# Patient Record
Sex: Male | Born: 1945 | Race: White | Hispanic: No | Smoking: Former smoker
Health system: Southern US, Community
[De-identification: ages and names within clinical notes are randomized; demographics above are authoritative.]

## PROBLEM LIST (undated history)

## (undated) DIAGNOSIS — F410 Panic disorder [episodic paroxysmal anxiety] without agoraphobia: Secondary | ICD-10-CM

## (undated) DIAGNOSIS — S82899A Other fracture of unspecified lower leg, initial encounter for closed fracture: Secondary | ICD-10-CM

## (undated) DIAGNOSIS — R972 Elevated prostate specific antigen [PSA]: Secondary | ICD-10-CM

## (undated) DIAGNOSIS — R310 Gross hematuria: Secondary | ICD-10-CM

## (undated) DIAGNOSIS — N401 Enlarged prostate with lower urinary tract symptoms: Secondary | ICD-10-CM

## (undated) DIAGNOSIS — R6882 Decreased libido: Secondary | ICD-10-CM

## (undated) DIAGNOSIS — N529 Male erectile dysfunction, unspecified: Secondary | ICD-10-CM

## (undated) HISTORY — DX: Other fracture of unspecified lower leg, initial encounter for closed fracture: S82.899A

## (undated) HISTORY — DX: Male erectile dysfunction, unspecified: N52.9

## (undated) HISTORY — DX: Benign prostatic hyperplasia with lower urinary tract symptoms: N40.1

## (undated) HISTORY — DX: Elevated prostate specific antigen (PSA): R97.20

## (undated) HISTORY — PX: ANKLE FRACTURE SURGERY: SHX122

## (undated) HISTORY — DX: Decreased libido: R68.82

## (undated) HISTORY — PX: TONSILLECTOMY: SUR1361

## (undated) HISTORY — DX: Gross hematuria: R31.0

## (undated) HISTORY — DX: Panic disorder (episodic paroxysmal anxiety): F41.0

---

## 2008-12-13 ENCOUNTER — Ambulatory Visit: Payer: Self-pay | Admitting: Gastroenterology

## 2008-12-21 ENCOUNTER — Ambulatory Visit: Payer: Self-pay | Admitting: Gastroenterology

## 2012-07-27 DIAGNOSIS — R6882 Decreased libido: Secondary | ICD-10-CM

## 2012-07-27 DIAGNOSIS — N529 Male erectile dysfunction, unspecified: Secondary | ICD-10-CM

## 2012-07-27 DIAGNOSIS — N401 Enlarged prostate with lower urinary tract symptoms: Secondary | ICD-10-CM

## 2012-07-27 HISTORY — DX: Benign prostatic hyperplasia with lower urinary tract symptoms: N40.1

## 2012-07-27 HISTORY — DX: Decreased libido: R68.82

## 2012-07-27 HISTORY — DX: Male erectile dysfunction, unspecified: N52.9

## 2013-02-27 ENCOUNTER — Ambulatory Visit: Payer: Self-pay | Admitting: Orthopedic Surgery

## 2013-03-07 ENCOUNTER — Ambulatory Visit: Payer: Self-pay | Admitting: Orthopedic Surgery

## 2013-07-31 DIAGNOSIS — R972 Elevated prostate specific antigen [PSA]: Secondary | ICD-10-CM

## 2013-07-31 HISTORY — DX: Elevated prostate specific antigen (PSA): R97.20

## 2014-08-10 ENCOUNTER — Emergency Department: Payer: Self-pay | Admitting: Emergency Medicine

## 2014-08-10 LAB — URINALYSIS, COMPLETE
Bacteria: NONE SEEN
RBC,UR: 48320 /HPF (ref 0–5)
Specific Gravity: 1.027 (ref 1.003–1.030)
Squamous Epithelial: NONE SEEN
WBC UR: NONE SEEN /HPF (ref 0–5)

## 2014-08-10 LAB — CBC WITH DIFFERENTIAL/PLATELET
BASOS ABS: 0.1 10*3/uL (ref 0.0–0.1)
Basophil %: 0.8 %
Eosinophil #: 0.4 10*3/uL (ref 0.0–0.7)
Eosinophil %: 3.8 %
HCT: 44.4 % (ref 40.0–52.0)
HGB: 14.6 g/dL (ref 13.0–18.0)
LYMPHS PCT: 14.8 %
Lymphocyte #: 1.5 10*3/uL (ref 1.0–3.6)
MCH: 29.9 pg (ref 26.0–34.0)
MCHC: 33 g/dL (ref 32.0–36.0)
MCV: 91 fL (ref 80–100)
Monocyte #: 0.7 x10 3/mm (ref 0.2–1.0)
Monocyte %: 7.1 %
NEUTROS ABS: 7.5 10*3/uL — AB (ref 1.4–6.5)
Neutrophil %: 73.5 %
PLATELETS: 231 10*3/uL (ref 150–440)
RBC: 4.89 10*6/uL (ref 4.40–5.90)
RDW: 14.7 % — AB (ref 11.5–14.5)
WBC: 10.2 10*3/uL (ref 3.8–10.6)

## 2014-08-10 LAB — BASIC METABOLIC PANEL
Anion Gap: 7 (ref 7–16)
BUN: 18 mg/dL (ref 7–18)
CREATININE: 1.36 mg/dL — AB (ref 0.60–1.30)
Calcium, Total: 8.9 mg/dL (ref 8.5–10.1)
Chloride: 104 mmol/L (ref 98–107)
Co2: 29 mmol/L (ref 21–32)
GFR CALC NON AF AMER: 55 — AB
GLUCOSE: 132 mg/dL — AB (ref 65–99)
Osmolality: 283 (ref 275–301)
Potassium: 4.1 mmol/L (ref 3.5–5.1)
Sodium: 140 mmol/L (ref 136–145)

## 2014-08-11 ENCOUNTER — Inpatient Hospital Stay: Payer: Self-pay | Admitting: Internal Medicine

## 2014-08-12 LAB — CBC WITH DIFFERENTIAL/PLATELET
BASOS ABS: 0.1 10*3/uL (ref 0.0–0.1)
BASOS PCT: 0.6 %
Eosinophil #: 0.3 10*3/uL (ref 0.0–0.7)
Eosinophil %: 2.3 %
HCT: 40.7 % (ref 40.0–52.0)
HGB: 13.4 g/dL (ref 13.0–18.0)
LYMPHS ABS: 1.6 10*3/uL (ref 1.0–3.6)
LYMPHS PCT: 12.8 %
MCH: 30 pg (ref 26.0–34.0)
MCHC: 33 g/dL (ref 32.0–36.0)
MCV: 91 fL (ref 80–100)
Monocyte #: 1 x10 3/mm (ref 0.2–1.0)
Monocyte %: 7.9 %
NEUTROS ABS: 9.5 10*3/uL — AB (ref 1.4–6.5)
Neutrophil %: 76.4 %
Platelet: 212 10*3/uL (ref 150–440)
RBC: 4.48 10*6/uL (ref 4.40–5.90)
RDW: 14.4 % (ref 11.5–14.5)
WBC: 12.5 10*3/uL — ABNORMAL HIGH (ref 3.8–10.6)

## 2014-08-12 LAB — BASIC METABOLIC PANEL
Anion Gap: 6 — ABNORMAL LOW (ref 7–16)
BUN: 17 mg/dL (ref 7–18)
CHLORIDE: 105 mmol/L (ref 98–107)
CO2: 27 mmol/L (ref 21–32)
CREATININE: 1.02 mg/dL (ref 0.60–1.30)
Calcium, Total: 8.6 mg/dL (ref 8.5–10.1)
EGFR (African American): >60
EGFR (Non-African Amer.): >60
Glucose: 91 mg/dL (ref 65–99)
Osmolality: 277 (ref 275–301)
POTASSIUM: 3.9 mmol/L (ref 3.5–5.1)
SODIUM: 138 mmol/L (ref 136–145)

## 2014-08-13 LAB — URINE CULTURE

## 2014-08-16 DIAGNOSIS — F410 Panic disorder [episodic paroxysmal anxiety] without agoraphobia: Secondary | ICD-10-CM

## 2014-08-16 DIAGNOSIS — T7840XA Allergy, unspecified, initial encounter: Secondary | ICD-10-CM | POA: Insufficient documentation

## 2014-08-16 DIAGNOSIS — S82899A Other fracture of unspecified lower leg, initial encounter for closed fracture: Secondary | ICD-10-CM | POA: Insufficient documentation

## 2014-08-16 HISTORY — DX: Panic disorder (episodic paroxysmal anxiety): F41.0

## 2014-10-01 DIAGNOSIS — R310 Gross hematuria: Secondary | ICD-10-CM

## 2014-10-01 HISTORY — DX: Gross hematuria: R31.0

## 2014-11-04 NOTE — Discharge Summary (Signed)
PATIENT NAME:  Andre Black, Andre MR#:  Black DATE OF BIRTH:  10-15-1945  DATE OF ADMISSION:  08/11/2014 DATE OF DISCHARGE:  08/12/2014  PRESENTING COMPLAINT: Bloody urine.  DISCHARGE DIAGNOSES: 1.  Acute hematuria secondary to most likely a bladder mass.  2.  Status post CBI, now has Foley catheter with self-irrigation. 3.  Hypertension.  4.  Benign prostatic hypertrophy.   PROCEDURES: CBI with Foley insertion.   CODE STATUS: FULL code.   CONSULTATIONS: Urology with Dr. Richardson LandryHouser.   DISCHARGE MEDICATIONS: 1.  Tamsulosin 0.4 mg p.o. daily.  2.  Amlodipine 2.5 mg daily.   DISCHARGE INSTRUCTIONS: The patient asked to hold off aspirin until hematuria resolves.  Follow up with Dr. Lonna CobbStoioff next week for hematuria and possible bladder mass.   The patient recommended continue self-cath irrigation per instructions with Foley care.  DIAGNOSTIC DATA: H and H 13.4 and 40.7. Creatinine 1.02.   CT of the abdomen shows hemorrhagic debris within the bladder which is compressed with the presence of Foley catheter. There is focal oval well-defined filling defect along the left posterior aspect of the bladder covering the trigone and measuring 4.1 x 5.7, which may represent blood clot versus mass. There is some degree of bladder outlet obstruction, 1.7 x 5.4 cm hyperdense collection posterior-superior to the prostate gland, likely a small region of hemorrhage.   BRIEF SUMMARY OF HOSPITAL COURSE: Andre Black is a 69 year old Caucasian gentleman with history of hypertension and BPH, who comes in with: 1.  Acute hematuria, which is suspected due to 4 x 5.7 cm bladder mass versus blood clot. He was seen by Dr. Richardson LandryHouser from urology who recommends continued CBI and go home with Foley catheter along with self-irrigation. The patient will follow up with his primary urologist, Dr. Lonna CobbStoioff, next week. He will call for an appointment on Monday morning.  2.  Hypertension. Continue amlodipine. 3.  BPH. Continue  tamsulosin. 4.  DVT prophylaxis with SCDs and TEDs since no antiplatelets were given due to the patient's hematuria.   Hospital stay otherwise remained stable. The patient remained a FULL code.   TIME SPENT: 40 minutes.  ____________________________ Wylie HailSona A. Allena KatzPatel, MD sap:sb D: 08/13/2014 07:00:18 ET T: 08/13/2014 07:57:17 ET JOB#: [id number]  cc: Akshaya Toepfer A. Allena KatzPatel, MD, <Dictator> Scott C. Lonna CobbStoioff, MD Tawni MillersEdward R. Weldon InchesHouser, II, MD  Willow OraSONA A Delonna Ney MD ELECTRONICALLY SIGNED 08/23/2014 23:25

## 2014-11-04 NOTE — Consult Note (Signed)
Admit Diagnosis:   HEMATURIA: Onset Date: 11-Aug-2014, Status: Active, Description: HEMATURIA      Admit Reason:   Hematuria (R31.9): Status: Active, Coding System: ICD10, Coded Name: Hematuria, unspecified    broken ankle:    enlarged prostate:    tonsilectomy:   Home Medications: Medication Instructions Status  tamsulosin 0.4 mg oral capsule 1 cap(s) orally once a day Active  Aspirin Enteric Coated 81 mg oral delayed release tablet 1 tab(s) orally once a day Active  amLODIPine 2.5 mg oral tablet 1 tab(s) orally once a day Active   Radiology Results:  Radiology Results: CT:    06-Feb-16 07:44, CT Abdomen Pelvis WO for Stone  CT Abdomen Pelvis WO for Stone  REASON FOR EXAM:    hematuria, urinary obstruction  COMMENTS:       PROCEDURE: CT  - CT ABDOMEN /PELVIS WO (STONE)  - Aug 11 2014  7:44AM     CLINICAL DATA:  Pt visible blood in urine starting last night around  7p, pt states pain only when bladder is full, no pain while  urinating. Pt had Foley cath placed this am and was released from  hospital. Pt had to return due to leaking around cath. Pt denies hx  of surgery to abd/pelvis or hx of ca. Standard Stone Study  performed.    EXAM:  CT ABDOMEN AND PELVIS WITHOUT CONTRAST  TECHNIQUE:  Multidetector CT imaging of the abdomen and pelvis was performed  following the standard protocol without IV contrast.    COMPARISON:  None.    FINDINGS:  Lung bases are within normal.    Abdominal images demonstrate multiple well-defined round oval liver  hypodensities likely cysts with the largest over the inferior right  lobe as well as left lobe measuring 2.1 cm. 2.1 cm gallstone. The  spleen, pancreas and adrenal glands are within normal. Appendix is  normal. There is mild calcified plaque over the abdominal aorta and  iliac arteries. Minimal diverticulosis of the colon without active  inflammation.    Kidneys are normal size without focal mass or nephrolithiasis.  No  significant hydronephrosis. Ureters are normal.    Pelvic images demonstrate a Foley catheter within the bladder. There  is increased density material within the bladder likely due to  hemorrhage. There is a fairly well-defined oval soft tissue density  creating a filling defect along the left posterior aspect of the  bladder which is isodense and continuous with the adjacent prostate  gland measuring approximately 4.1 x 5.7 cm in AP and transverse  dimension. This may represent blood clot versus mass extends over  the region of the trigone which may be causing a degree of outlet  obstruction. The Foley catheter passes through the anterior inferior  aspect of this filling defect. There are 2 small bilateral iliac  chain lymph nodes with the largest over the left side measuring 1 x  1.6 cm. 1.7 x 5.4 cm collection of slightly hyperdense material just  posterior and superior to the left side of the prostate likely a  small amount of hemorrhage.    There are moderate degenerative changes of the spine with multilevel  discdisease of the lumbar spine. 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 Degenerative changes of the hips.     IMPRESSION:  Hemorrhagic debris within the bladder which is decompressed with the  presence of a Foley catheter. There is a focal oval well-defined  filling defect  along the left posterior aspect of the bladder  covering the trigone and measuring 4.1 x 5.7 cm which may represent  blood clot versus mass. This may be causing some degree of bladder  outlet obstruction. 1.7 x 5.4 cm slightly hyperdense collection  posterior/superior to the prostate gland likely a small region of  hemorrhage. Few small iliac chain lymph nodes left greater than  right.    No evidence of for renal/ureteral stones or hydronephrosis.    Multiple liver cysts.    Cholelithiasis.    Minimal colonic  diverticulosis.    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    These results were called by telephone at the time of interpretation  on 08/11/2014 at 8:23 am to Dr. Wynonia Lawman , who verbally  acknowledged these results.      Electronically Signed    By: Elberta Fortis M.D.    On: 08/11/2014 08:24         Verified By: Elba Barman, M.D.,    Adhesive: Blisters, Rash  Other -Explain in Comment Field: Blisters, Rash  Nursing Flowsheets: **Vital Signs.:   06-Feb-16 08:29  Vital Signs Type Admission  Temperature Temperature (F) 97.4  Celsius 36.3  Temperature Source oral  Respirations Respirations 18  Systolic BP Systolic BP 151  Diastolic BP (mmHg) Diastolic BP (mmHg) 76  Mean BP 101  Pulse Ox % Pulse Ox % 95  Pulse Ox Activity Level  At rest  Oxygen Delivery Room Air/ 21 %    Present Illness 69 yo male in usual state of health admitted with gross hematuria and clots overnight.  Has prior history for BPH managed by Dr. Lonna Cobb with Tamsulosin.  Has yearly exams with him as well.  Has some baseline frequency and nocturia but denies history of hematuria.  This episode started last evening with bright red blood and progressed to clots as well.  No dysuria, fever, chills, or pain.  He denies any weight loss.  He does have a 45 pack/yr smoking history (quit in 2006).  Pt. has no other complaints.   Case History and Physical Exam:  Chief Complaint Gross Hematuria   Past Medical Health Hypertension, BPH   Past Surgical History None   Family History Non-Contributory   HEENT PERLA  NCAT   Neck/Nodes Supple  No Adenopathy   Chest/Lungs Clear   Cardiovascular Normal Sinus Rhythm   Abdomen Benign  x 4 quadrants   Genitalia WNL  24 Fr 3-way, pink uop, genetalia normal   Rectal Not examined   Musculoskeletal Full range of motion    Neurological Grossly WNL   Skin Warm  WNL    Impression 69 yo male with gross hematuria and 5.7 x 4.1 cm left sided bladder mass.  While could represent clot or extension of prostate, I suspect this is a large bladder tumor given his smoking history.   This was communicated with him in detail and he voices understanding.  The need for outpatient cystoscopy once clear with his primary Urologist was discussed.  I irrigated his catheter and was able to remove approx 30 ml of clot.  No further clot was removed and CBI was running clear on low rate.   Plan - continue CBI, wean to clear - likely home with catheter, could teach self-irrigations if needed - needs f/u for outpatient cystoscopy this coming week -  if hematuria worses, clot retention is unable to be cleared, or need for multiple transfusions arise, would consider cysto/clot evac/fulguration.   Electronic Signatures: Charyl Bigger (MD)  (Signed 06-Feb-16 10:48)  Authored: Health Issues, Significant Events - History, Home Medications, Radiology Results, Allergies, Vital Signs, General Aspect/Present Illness, History and Physical Exam, Impression/Plan   Last Updated: 06-Feb-16 10:48 by Charyl Bigger (MD)

## 2014-11-04 NOTE — H&P (Signed)
PATIENT NAME:  Andre Black, Beacher MR#:  [id number] DATE OF BIRTH:  07-29-1945  DATE OF ADMISSION:  08/11/2014  PRIMARY CARE PHYSICIAN: Rhona LeavensJames F. Burnett ShengHedrick, MD  CHIEF COMPLAINT: Bloody urine today.   HISTORY OF PRESENT ILLNESS: Andre Black is a 69 year old Caucasian gentleman with past medical history of hypertension and BPH who came to the Emergency Room last night after he started having a significant amount of hematuria without any abdominal pain. A Foley catheter was put in. the patient was started on CBI and, once his urine cleared out, he was discharged to home to follow up with outpatient urology. The patient's urologist is Dr. [facility]Sto[facility]ff at [facility] [place]. As he was going, he  started having abdominal pain. His catheter appeared to have clotted, and he came back. CBI was started again. The patient started having a lot of clots, and internal medicine was consulted for admission.   His CT of the abdomen shows 5.7 x 4.1 cm, left-sided bladder mass. Cannot exclude that this could be a clot. He is being admitted for further evaluation and management.   PAST MEDICAL HISTORY:   1.  Hypertension.  2.  Benign prostatic hypertrophy.   FAMILY HISTORY: Brother with stomach cancer and another brother with throat and lung cancer.   MEDICATIONS:  1.  Tamsulosin 0.4 mg p.o. daily.  2.  Aspirin 81 mg daily.  3.  Amlodipine 2.5 mg daily.   ALLERGIES: Adhesive tape.    SOCIAL HISTORY: He lives by himself. He is a nonsmoker and denies any alcohol use.   REVIEW OF SYSTEMS: CONSTITUTIONAL: No fever, fatigue, weakness.  EYES: No blurred or double vision, glaucoma, or cataracts.  EARS, NOSE, THROAT: No tinnitus, ear pain, hearing loss.  RESPIRATORY: No cough, wheeze, hemoptysis.  CARDIOVASCULAR: No chest pain, orthopnea, edema. Positive for hypertension.  GASTROINTESTINAL: No nausea, vomiting, diarrhea, abdominal pain.  GENITOURINARY: Positive for hematuria. No frequency. Positive for enlarged prostate.   MUSCULOSKELETAL: No arthritis. Positive for back pain on and off. No swelling or gout.  NEUROLOGIC: No CVA, TIA, dysarthria, or seizures.  PSYCHIATRIC: No anxiety or depression.  All other systems reviewed are negative.   PHYSICAL EXAMINATION:  GENERAL: The patient is awake, alert, oriented x 3; not in acute distress.  VITAL SIGNS: Afebrile. Pulse is 79, blood pressure is 151/76, saturations are 95% on room air.  HEENT: Atraumatic, normocephalic. Pupils PERRLA. EOM intact. Oral mucosa is moist.  NECK: Supple. No JVD. No carotid bruit.  LUNGS: Clear to auscultation bilaterally. No rales, rhonchi, respiratory distress or labored breathing.  HEART: Both the heart sounds are normal. Rate and rhythm regular. PMI not lateralized. Chest nontender.  EXTREMITIES: Good pedal pulses, good femoral pulses. No lower extremity edema.  GENITOURINARY: The patient has a catheter with CBI ongoing; has pinkish-tinged urine output.  NEUROLOGIC: Grossly intact cranial nerves II through XII. No motor or sensory deficit.  SKIN: Warm and dry.  PSYCHIATRIC: The patient is awake, alert, oriented x 3.   LABORATORY DATA: CT of the abdomen and pelvis shows hemorrhaging debris within the bladder, which is decompressed with the presence of a Foley catheter. Focal, oval, well-defined filling defect along the left posterior aspect of the bladder, covering the trigone, measuring 4.1 x 5.7 cm, which may present blood clot versus mass. This may be causing some degree of bladder  outlet obstruction. A 1.7 x 4.5 cm, slightly hyperdense collection in the posterior superior to the prostate gland; likely a small region of hemorrhage. Few small iliac chain lymph  nodes, greater on the left than the right. Multiple liver cysts. Cholelithiasis. Minimal of colonic diverticulosis.   White count is 10.2, H and H are 14.4 and 44.4. Glucose is 132, BUN is 18, creatinine 1.3, sodium is 140, potassium is 4.1, chloride is 104. Urine positive for  hematuria.   ASSESSMENT: A 69 year old Andre Black with a history of benign prostatic hypertrophy and hypertension comes in with:  1.  Acute hematuria, which is suspected due to possible 4 x 5.7 cm bladder mass versus blood clot. The patient was seen by Dr. Richardson Landry from urology, who recommends continue CBI and possibly to go home with Foley catheter. Could teach self irrigations if needed, and follow up with Dr. Lonna Cobb as an outpatient next week. If condition worsens, will consider cystoscopy, clot evacuation and fulguration here in the hospital.  2.  Hypertension. Continue amlodipine.  3.  Benign prostatic hypertrophy on tamsulosin, which we will continue. 4.  Deep vein thrombosis prophylaxis. TEDs and SCDs in the setting of hematuria.   Above was discussed with the patient. The patient is a Full Code. Further work-up according to the patient's clinical course.   TIME SPENT: 50 minutes.    ____________________________ Wylie Hail Allena Katz, MD sap:MT D: 08/11/2014 12:22:38 ET T: 08/11/2014 12:41:05 ET JOB#: [id number]  cc: Kariem Wolfson A. Allena Katz, MD, <Dictator> Rhona Leavens. Burnett Sheng, MD Scott C. Lonna Cobb, MD Willow Ora MD ELECTRONICALLY SIGNED 08/23/2014 23:25

## 2015-06-05 IMAGING — CT CT ABD-PELV W/O CM
2 of 4 series · 15 of 46 positions shown, 17 images · non-contrast
Comparison: None.

CLINICAL DATA: Pt visible blood in urine starting last night around
7p, pt states pain only when bladder is full, no pain while
urinating. Pt had Foley cath placed this am and was released from
hospital. Pt had to return due to leaking around cath. Pt denies hx
of surgery to abd/pelvis or hx of ca. Standard Stone Study
performed.

EXAM:
CT ABDOMEN AND PELVIS WITHOUT CONTRAST
TECHNIQUE: Multidetector CT imaging of the abdomen and pelvis was performed
following the standard protocol without IV contrast.

[Series 2: stone standard full · axial · 0.76mm/px · z∈[-1026,-600]mm · 12 of 93 slices shown, 14 images]
[im 4/93  soft-tissue]
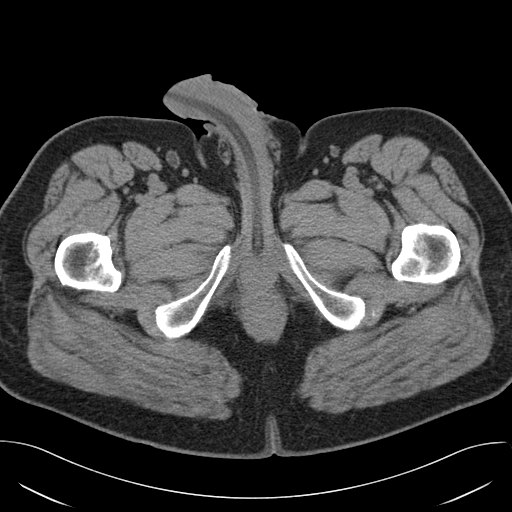
[im 4/93  bone]
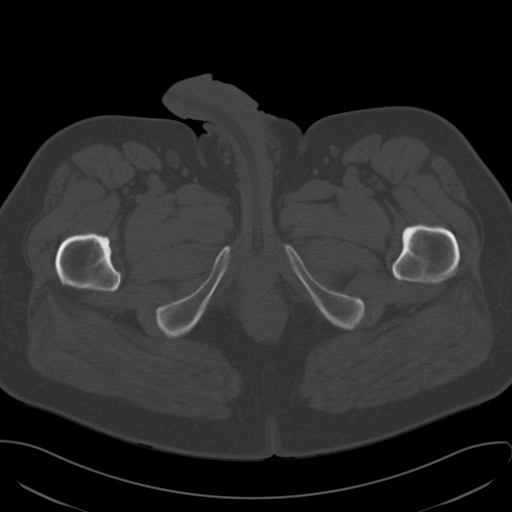
[im 12/93  soft-tissue]
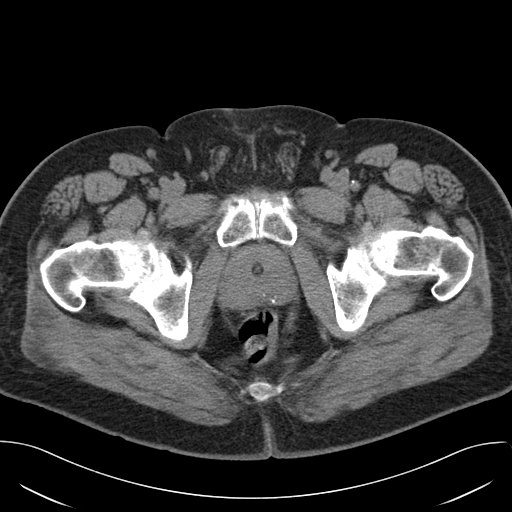
[im 20/93  soft-tissue]
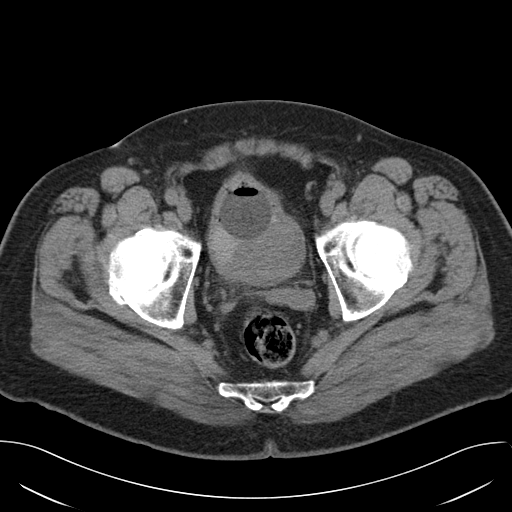
[im 27/93  soft-tissue]
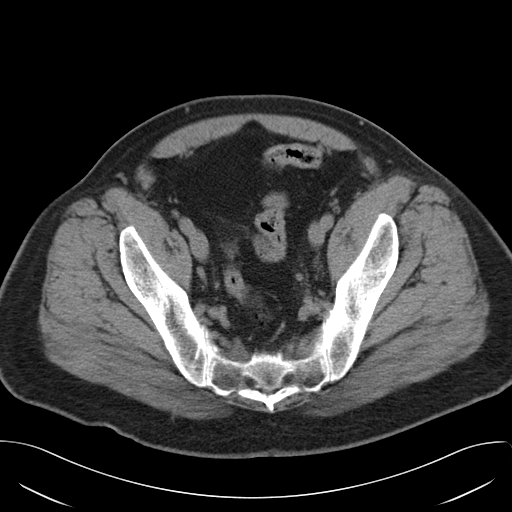
[im 35/93  soft-tissue]
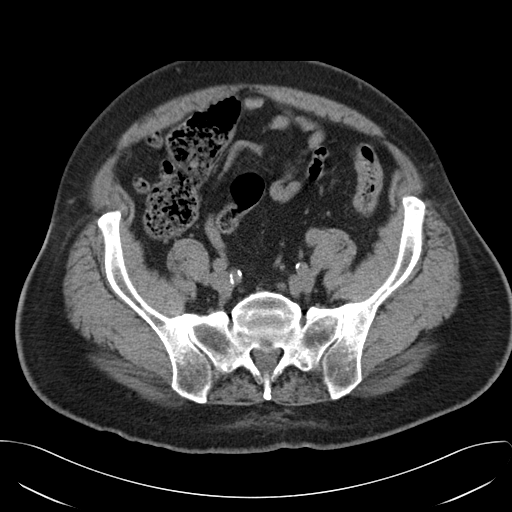
[im 43/93  soft-tissue]
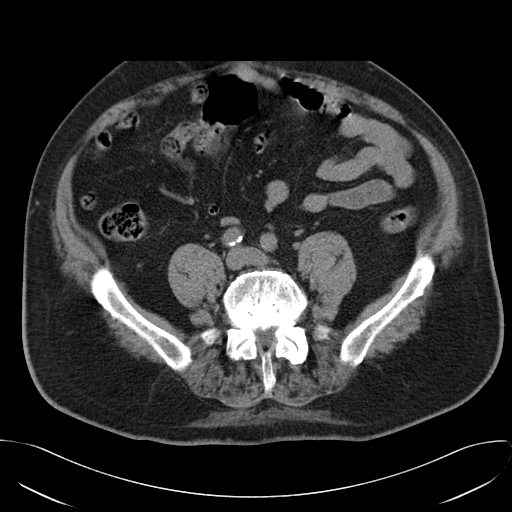
[im 50/93  soft-tissue]
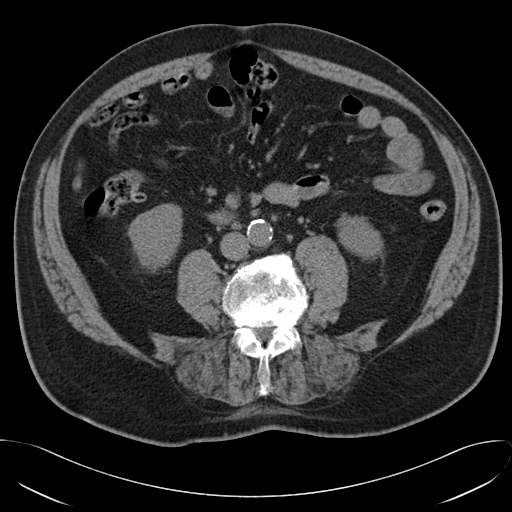
[im 58/93  soft-tissue]
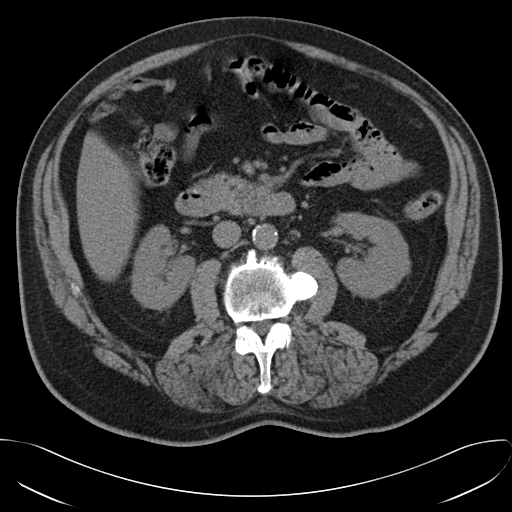
[im 66/93  soft-tissue]
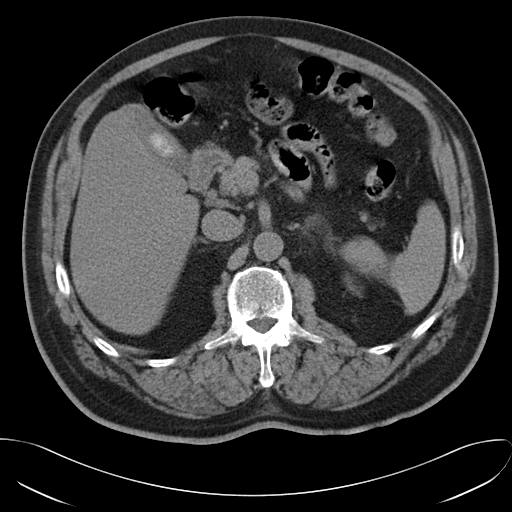
[im 66/93  bone]
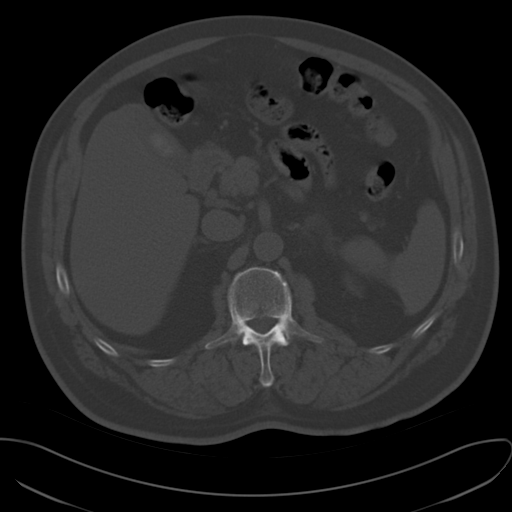
[im 73/93  soft-tissue]
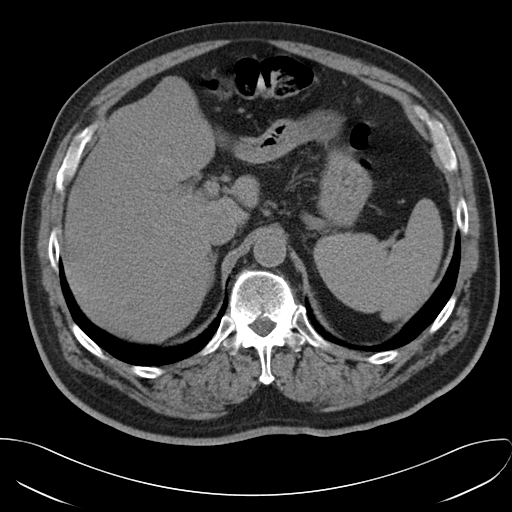
[im 81/93  soft-tissue]
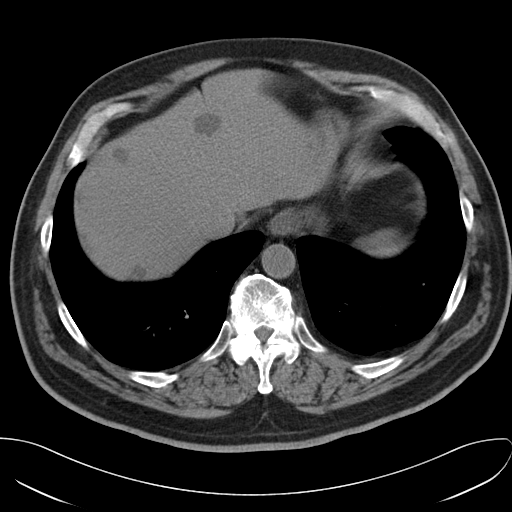
[im 89/93  soft-tissue]
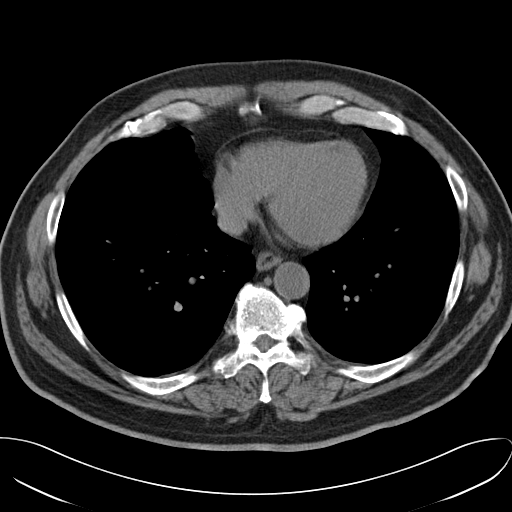

[Series 5: cor stone standard full · coronal · 0.72mm/px · 3 of 158 slices shown]
[im 53/158  soft-tissue]
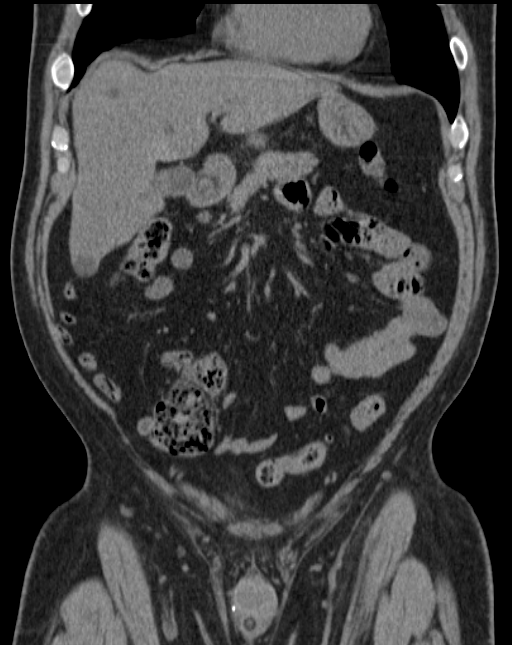
[im 70/158  soft-tissue]
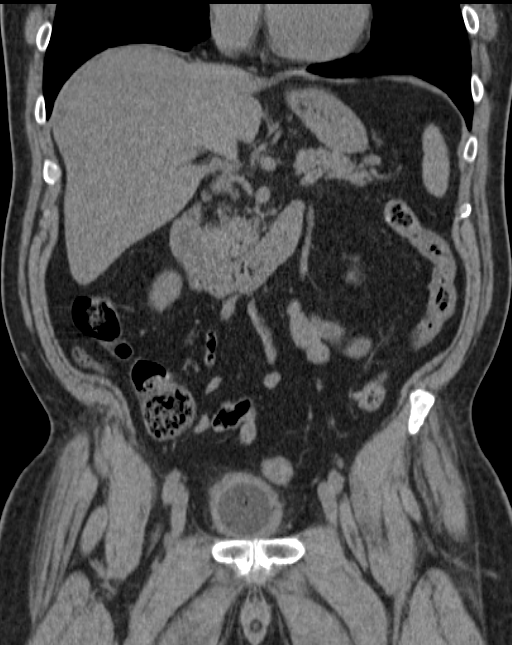
[im 88/158  soft-tissue]
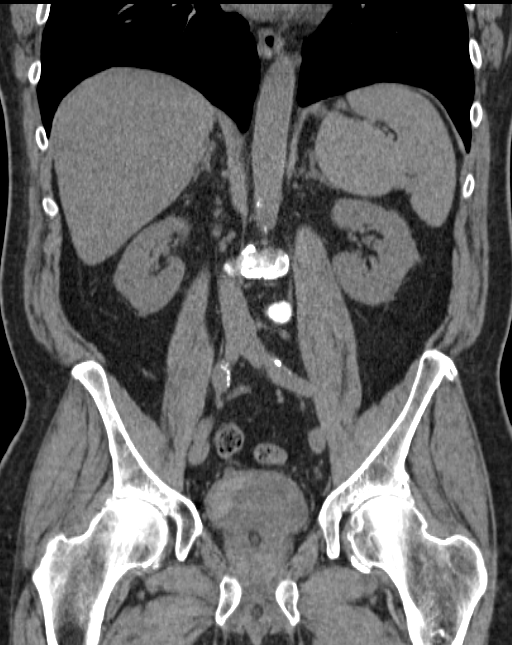

[15 of 46 positions shown; findings below may reference images not displayed]

FINDINGS: Lung bases are within normal.

Abdominal images demonstrate multiple well-defined round oval liver
hypodensities likely cysts with the largest over the inferior right
lobe as well as left lobe measuring 2.1 cm. 2.1 cm gallstone. The
spleen, pancreas and adrenal glands are within normal. Appendix is
normal. There is mild calcified plaque over the abdominal aorta and
iliac arteries. Minimal diverticulosis of the colon without active
inflammation.

Kidneys are normal size without focal mass or nephrolithiasis. No
significant hydronephrosis. Ureters are normal.

Pelvic images demonstrate a Foley catheter within the bladder. There
is increased density material within the bladder likely due to
hemorrhage. There is a fairly well-defined oval soft tissue density
creating a filling defect along the left posterior aspect of the
bladder which is isodense and continuous with the adjacent prostate
gland measuring approximately 4.1 x 5.7 cm in AP and transverse
dimension. This may represent blood clot versus mass extends over
the region of the trigone which may be causing a degree of outlet
obstruction. The Foley catheter passes through the anterior inferior
aspect of this filling defect. There are 2 small bilateral iliac
chain lymph nodes with the largest over the left side measuring 1 x
1.6 cm. 1.7 x 5.4 cm collection of slightly hyperdense material just
posterior and superior to the left side of the prostate likely a
small amount of hemorrhage.

There are moderate degenerative changes of the spine with multilevel
disc disease of the lumbar spine. Evidence of congenital short
pedicles with varying degrees of canal stenosis over the lumbar
region. Degenerative changes of the hips.
IMPRESSION: Hemorrhagic debris within the bladder which is decompressed with the
presence of a Foley catheter. There is a focal oval well-defined
filling defect along the left posterior aspect of the bladder
covering the trigone and measuring 4.1 x 5.7 cm which may represent
blood clot versus mass. This may be causing some degree of bladder
outlet obstruction. 1.7 x 5.4 cm slightly hyperdense collection
posterior/superior to the prostate gland likely a small region of
hemorrhage. Few small iliac chain lymph nodes left greater than
right.

No evidence of for renal/ureteral stones or hydronephrosis.

Multiple liver cysts.

Cholelithiasis.

Minimal colonic diverticulosis.

Degenerative change of the spine with multilevel disc disease over
the lumbar spine and congenital short pedicles with varying degrees
of canal stenosis over the lumbar region.

These results were called by telephone at the time of interpretation
on 08/11/2014 at [DATE] to Dr. Angi Billiot , who verbally
acknowledged these results.

## 2017-04-27 ENCOUNTER — Other Ambulatory Visit: Payer: Self-pay

## 2017-04-27 ENCOUNTER — Other Ambulatory Visit: Payer: Medicare PPO

## 2017-04-27 DIAGNOSIS — N401 Enlarged prostate with lower urinary tract symptoms: Secondary | ICD-10-CM

## 2017-04-28 LAB — PSA: PROSTATE SPECIFIC AG, SERUM: 6.4 ng/mL — AB (ref 0.0–4.0)

## 2017-04-29 ENCOUNTER — Ambulatory Visit
Admission: RE | Admit: 2017-04-29 | Discharge: 2017-04-29 | Disposition: A | Discharge status: Home / Self Care | Payer: Medicare PPO | Source: Ambulatory Visit | Attending: Urology | Admitting: Urology

## 2017-04-29 ENCOUNTER — Telehealth: Payer: Self-pay | Admitting: *Deleted

## 2017-04-29 ENCOUNTER — Ambulatory Visit: Payer: Medicare PPO | Admitting: Urology

## 2017-04-29 ENCOUNTER — Encounter: Payer: Self-pay | Admitting: Urology

## 2017-04-29 VITALS — BP 124/57 | HR 98 | Ht 71.0 in | Wt 245.0 lb

## 2017-04-29 DIAGNOSIS — R1030 Lower abdominal pain, unspecified: Secondary | ICD-10-CM

## 2017-04-29 DIAGNOSIS — S82899A Other fracture of unspecified lower leg, initial encounter for closed fracture: Secondary | ICD-10-CM | POA: Insufficient documentation

## 2017-04-29 DIAGNOSIS — R972 Elevated prostate specific antigen [PSA]: Secondary | ICD-10-CM

## 2017-04-29 DIAGNOSIS — N401 Enlarged prostate with lower urinary tract symptoms: Secondary | ICD-10-CM | POA: Diagnosis not present

## 2017-04-29 HISTORY — DX: Other fracture of unspecified lower leg, initial encounter for closed fracture: S82.899A

## 2017-04-29 NOTE — Progress Notes (Signed)
04/29/2017 9:35 AM   Andre Black February 03, 1946 [id number]   Chief Complaint  Patient presents with  . Benign Prostatic Hypertrophy    16month    HPI: 71 yo presents for follow-up BPH and elevated PSA.  Prior biopsy May 2010 for a PSA of 8.48 with benign pathology.  Prostate volume was calculated at 48 cc.  He had been on combination therapy with finasteride and tamsulosin however is presently on tamsulosin only.  Prostate MRI April 2017 showed BPH and no suspicious lesion.  He has stable lower urinary tract symptoms.  Last PSA August 2018 was 6.69.  A CT of the pelvis was performed September 2017 for follow-up of mildly prominent left external iliac lymph nodes which were stable.  Denies dysuria or gross hematuria.  Denies flank or abdominal pain.  He has mild groin pain and requested an x-ray today.   PMH: Past Medical History:  Diagnosis Date  . Broken ankle 04/29/2017  . 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 Elevated PSA 07/31/2013  . Enlarged prostate with lower urinary tract symptoms (LUTS) 07/27/2012  . Hematuria, gross 10/01/2014  . Panic attack 08/16/2014   Overview:  DUE TO A DIVORCE   Overview:  Overview:  DUE TO A DIVORCE   . Reduced libido 07/27/2012    Surgical History: Past Surgical History:  Procedure Laterality Date  . ANKLE FRACTURE SURGERY    . TONSILLECTOMY      Home Medications:  Allergies as of 04/29/2017      Reactions   Other Dermatitis, Hives, Itching, Photosensitivity, Rash, Swelling   Other reaction(s): Other (See Comments) Pt is allergic to all metals and solvents states his skin "falls off" : (chlorocresol, balsam of [place]iperu, cobalt chloride, cocamidopropyl betaine, chrome, gold, silver)   Cashew Nut Oil Hives   Iodides    Other reaction(s): Unknown Other reaction(s): UNKNOWN   Balsam Rash   Chlorocresol Rash   Cobalt Rash      Medication List       Accurate as of 04/29/17  9:35 AM. Always use your most recent med list.            amLODipine 5 MG tablet Commonly known as:  NORVASC Take 5 mg by mouth daily.   aspirin EC 81 MG tablet Take 81 mg by mouth.   tamsulosin 0.4 MG Caps capsule Commonly known as:  FLOMAX TAKE 1 CAPSULE (0.4 MG TOTAL) BY MOUTH DAILY.       Allergies:  Allergies  Allergen Reactions  . Other Dermatitis, Hives, Itching, Photosensitivity, Rash and Swelling    Other reaction(s): Other (See Comments) Pt is allergic to all metals and solvents states his skin "falls off" : (chlorocresol, balsam of [place], cobalt chloride, cocamidopropyl betaine, chrome, gold, silver)  . Cashew Nut Oil Hives  . Iodides     Other reaction(s): Unknown Other reaction(s): UNKNOWN  . Balsam Rash  . Chlorocresol Rash  . Cobalt Rash    Family History: Family History  Problem Relation Age of Onset  . Bladder Cancer Brother   . Kidney cancer Neg Hx   . Prostate cancer Neg Hx     Social History:  reports that he has quit smoking. He has never used smokeless tobacco. He reports that he does not drink alcohol or use drugs.  ROS: UROLOGY Frequent Urination?: No Hard to postpone urination?: No Burning/pain with urination?: No Get up at night to urinate?: Yes Leakage of urine?: No Urine stream starts and stops?: No  Trouble starting stream?: No Do you have to strain to urinate?: No Blood in urine?: No Urinary tract infection?: No Sexually transmitted disease?: No Injury to kidneys or bladder?: No Painful intercourse?: No Weak stream?: No Erection problems?: No Penile pain?: No  Gastrointestinal Nausea?: No Vomiting?: No Indigestion/heartburn?: No Diarrhea?: No Constipation?: No  Constitutional Fever: No Night sweats?: No Weight loss?: No Fatigue?: No  Skin Skin rash/lesions?: Yes Itching?: Yes  Eyes Blurred vision?: No Double vision?: No  Ears/Nose/Throat Sore throat?: No Sinus problems?: No  Hematologic/Lymphatic Swollen glands?: No Easy bruising?:  No  Cardiovascular Leg swelling?: No Chest pain?: No  Respiratory Cough?: No Shortness of breath?: No  Endocrine Excessive thirst?: No  Musculoskeletal Back pain?: No Joint pain?: No  Neurological Headaches?: No Dizziness?: No  Psychologic Depression?: No Anxiety?: No  Physical Exam: BP (!) 124/57   Pulse 98   Ht 5\' 11"  (1.803 m)   Wt 245 lb (111.1 kg)   BMI 34.17 kg/m   Constitutional:  Alert and oriented, No acute distress. HEENT: [place] AT, moist mucus membranes.  Trachea midline, no masses. Cardiovascular: No clubbing, cyanosis, or edema. Respiratory: Normal respiratory effort, no increased work of breathing. GI: Abdomen is soft, nontender, nondistended, no abdominal masses GU: No CVA tenderness.  Skin: No rashes, bruises or suspicious lesions. Lymph: No cervical or inguinal adenopathy. Neurologic: Grossly intact, no focal deficits, moving all 4 extremities. Psychiatric: Normal mood and affect.  Laboratory Data: Lab Results  Component Value Date   WBC 12.5 (H) 08/12/2014   HGB 13.4 08/12/2014   HCT 40.7 08/12/2014   MCV 91 08/12/2014   PLT 212 08/12/2014    Lab Results  Component Value Date   CREATININE 1.02 08/12/2014    Lab Results  Component Value Date   PSA1 6.4 (H) 04/27/2017    Urinalysis Lab Results  Component Value Date   APPEARANCEUR TURBID 08/10/2014   LEUKOCYTESUR see comment 08/10/2014   PROTEINUR see comment 08/10/2014   GLUCOSEU see comment 08/10/2014   RBCU 48320 /HPF 08/10/2014   BILIRUBINUR see comment 08/10/2014   NITRITE SEE COMMENT 08/10/2014    Lab Results  Component Value Date   BACTERIA NONE SEEN 08/10/2014     Assessment & Plan:  1.  BPH with lower urinary tract symptoms Stable.  Continue tamsulosin.  2.  Elevated PSA PSA drawn earlier this week stable at 6.4  3.  Groin pain Most likely musculoskeletal.  Recent negative pelvic MRI and CT.  KUB was ordered  Return in about 6 months (around 10/28/2017).       Riki Altes, MD  [facility] [address] [place], [place] [address] [phone]

## 2017-04-29 NOTE — Telephone Encounter (Signed)
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

## 2017-04-29 NOTE — Telephone Encounter (Signed)
Spoke with patient and gave results. 

## 2017-06-09 ENCOUNTER — Ambulatory Visit: Admission: RE | Admit: 2017-06-09 | Payer: Medicare PPO | Source: Ambulatory Visit | Admitting: Internal Medicine

## 2017-06-09 ENCOUNTER — Encounter: Admission: RE | Payer: Self-pay | Source: Ambulatory Visit

## 2017-06-09 SURGERY — ESOPHAGOGASTRODUODENOSCOPY (EGD) WITH PROPOFOL
Anesthesia: General

## 2017-10-27 ENCOUNTER — Ambulatory Visit: Payer: Medicare PPO | Admitting: Urology

## 2017-10-27 ENCOUNTER — Encounter: Payer: Self-pay | Admitting: Urology

## 2017-11-26 ENCOUNTER — Ambulatory Visit (INDEPENDENT_AMBULATORY_CARE_PROVIDER_SITE_OTHER): Payer: Medicare PPO | Admitting: Urology

## 2017-11-26 ENCOUNTER — Encounter: Payer: Self-pay | Admitting: Urology

## 2017-11-26 VITALS — BP 136/76 | HR 78 | Ht 72.0 in | Wt 240.0 lb

## 2017-11-26 DIAGNOSIS — N401 Enlarged prostate with lower urinary tract symptoms: Secondary | ICD-10-CM

## 2017-11-26 DIAGNOSIS — R351 Nocturia: Secondary | ICD-10-CM | POA: Diagnosis not present

## 2017-11-26 DIAGNOSIS — R972 Elevated prostate specific antigen [PSA]: Secondary | ICD-10-CM

## 2017-11-26 NOTE — Progress Notes (Signed)
11/26/2017 1:50 PM   Andre Black 05/21/1946 [id number]  Referring provider: Jerl Mina, MD [address] [facility] [place], [place] [address]  Chief Complaint  Patient presents with  . Other  . Follow-up   Urologic problem list: -Elevated PSA; prostate biopsy May 2010 for PSA 8.48 with benign pathology, prostate volume 48 cc, prostate MRI 4/17 without suspicious lesions  -BPH with lower urinary tract symptoms; finasteride  HPI: 72 year old male presents for a six-month follow-up.  He has stable lower urinary tract symptoms on tamsulosin.  He has nocturia every 2 hours which he states is not bothersome.  His last PSA October 2018 was stable at 6.4.   PMH: Past Medical History:  Diagnosis Date  . Broken ankle 04/29/2017  . 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 Elevated PSA 07/31/2013  . Enlarged prostate with lower urinary tract symptoms (LUTS) 07/27/2012  . Hematuria, gross 10/01/2014  . Panic attack 08/16/2014   Overview:  DUE TO A DIVORCE   Overview:  Overview:  DUE TO A DIVORCE   . Reduced libido 07/27/2012    Surgical History: Past Surgical History:  Procedure Laterality Date  . ANKLE FRACTURE SURGERY    . TONSILLECTOMY      Home Medications:  Allergies as of 11/26/2017      Reactions   Other Dermatitis, Hives, Itching, Photosensitivity, Rash, Swelling   Other reaction(s): Other (See Comments) Pt is allergic to all metals and solvents states his skin "falls off" : (chlorocresol, balsam of [place], cobalt chloride, cocamidopropyl betaine, chrome, gold, silver)   Cashew Nut Oil Hives   Iodides    Other reaction(s): Unknown Other reaction(s): UNKNOWN   Balsam Rash   Chlorocresol Rash   Cobalt Rash      Medication List        Accurate as of 11/26/17  1:50 PM. Always use your most recent med list.          amLODipine 5 MG tablet Commonly known as:  NORVASC Take by mouth.   aspirin EC 81 MG tablet Take 81 mg by mouth.     tamsulosin 0.4 MG Caps capsule Commonly known as:  FLOMAX TAKE 1 CAPSULE (0.4 MG TOTAL) BY MOUTH DAILY.       Allergies:  Allergies  Allergen Reactions  . Other Dermatitis, Hives, Itching, Photosensitivity, Rash and Swelling    Other reaction(s): Other (See Comments) Pt is allergic to all metals and solvents states his skin "falls off" : (chlorocresol, balsam of [place], cobalt chloride, cocamidopropyl betaine, chrome, gold, silver)  . Cashew Nut Oil Hives  . Iodides     Other reaction(s): Unknown Other reaction(s): UNKNOWN  . Balsam Rash  . Chlorocresol Rash  . Cobalt Rash    Family History: Family History  Problem Relation Age of Onset  . Bladder Cancer Brother   . Kidney cancer Neg Hx   . Prostate cancer Neg Hx     Social History:  reports that he has quit smoking. He has never used smokeless tobacco. He reports that he does not drink alcohol or use drugs.  ROS: UROLOGY Frequent Urination?: Yes Hard to postpone urination?: No Burning/pain with urination?: No Get up at night to urinate?: Yes Leakage of urine?: No Urine stream starts and stops?: No Trouble starting stream?: No Do you have to strain to urinate?: No Blood in urine?: No Urinary tract infection?: No Sexually transmitted disease?: No Injury to kidneys or bladder?: No Painful intercourse?: No Weak stream?: No  Erection problems?: No Penile pain?: No  Gastrointestinal Nausea?: No Vomiting?: No Indigestion/heartburn?: No Diarrhea?: No Constipation?: No  Constitutional Fever: No Night sweats?: No Weight loss?: No Fatigue?: No  Skin Skin rash/lesions?: No Itching?: No  Eyes Blurred vision?: No Double vision?: No  Ears/Nose/Throat Sore throat?: No Sinus problems?: No  Hematologic/Lymphatic Swollen glands?: No Easy bruising?: No  Cardiovascular Leg swelling?: Yes Chest pain?: No  Respiratory Cough?: No Shortness of breath?: No  Endocrine Excessive thirst?:  No  Musculoskeletal Back pain?: Yes Joint pain?: No  Neurological Headaches?: No Dizziness?: No  Psychologic Depression?: No Anxiety?: No  Physical Exam: BP 136/76   Pulse 78   Ht 6' (1.829 m)   Wt 240 lb (108.9 kg)   BMI 32.55 kg/m   Constitutional:  Alert and oriented, No acute distress. HEENT: [place] AT, moist mucus membranes.  Trachea midline, no masses. Cardiovascular: No clubbing, cyanosis, or edema. Respiratory: Normal respiratory effort, no increased work of breathing. GI: Abdomen is soft, nontender, nondistended, no abdominal masses GU: No CVA tenderness.  Prostate 50 g, smooth without nodules. Lymph: No cervical or inguinal lymphadenopathy. Skin: No rashes, bruises or suspicious lesions. Neurologic: Grossly intact, no focal deficits, moving all 4 extremities. Psychiatric: Normal mood and affect.   Assessment & Plan:   72 year old male with an elevated PSA and lower urinary tract symptoms which are stable on tamsulosin.  PSA was drawn today and if no significant change he will follow-up in 1 year.  Continue tamsulosin.  Return earlier for any significant change in his voiding pattern.  Return in about 1 year (around 11/27/2018) for Recheck, PSA.  Riki Altes, MD  [facility] [address], [address] [place], [place] [address] [phone]

## 2017-11-27 LAB — PSA: Prostate Specific Ag, Serum: 5 ng/mL — ABNORMAL HIGH (ref 0.0–4.0)

## 2017-11-30 ENCOUNTER — Telehealth: Payer: Self-pay | Admitting: Family Medicine

## 2017-11-30 NOTE — Telephone Encounter (Signed)
Patient notified

## 2017-11-30 NOTE — Telephone Encounter (Signed)
-----   Message from Riki Altes, MD sent at 11/30/2017 11:39 AM EDT ----- PSA stable at 5.0

## 2018-01-28 ENCOUNTER — Other Ambulatory Visit: Payer: Self-pay | Admitting: Family Medicine

## 2018-01-28 ENCOUNTER — Telehealth: Payer: Self-pay | Admitting: Urology

## 2018-01-28 MED ORDER — TAMSULOSIN HCL 0.4 MG PO CAPS: ORAL_CAPSULE | ORAL | 6 refills | Status: DC

## 2018-01-28 NOTE — Telephone Encounter (Signed)
Done

## 2018-01-28 NOTE — Telephone Encounter (Signed)
Pt needs a refill on Tamsulosin.  Pharmacy said they sent over a request.  Pt has only 1 pill left.  [facility] [address]  Please call pt [phone]

## 2018-02-21 IMAGING — CR DG ABDOMEN 1V
1 series · 2 of 2 positions shown · non-contrast
Comparison: CT scan August 11, 2014

CLINICAL DATA: Inguinal pain.  Pelvic pain.

EXAM:
ABDOMEN - 1 VIEW

[Series 1: dg abd 1 view · 0.14mm/px · 2 of 2 slices shown]
[im 1/2]
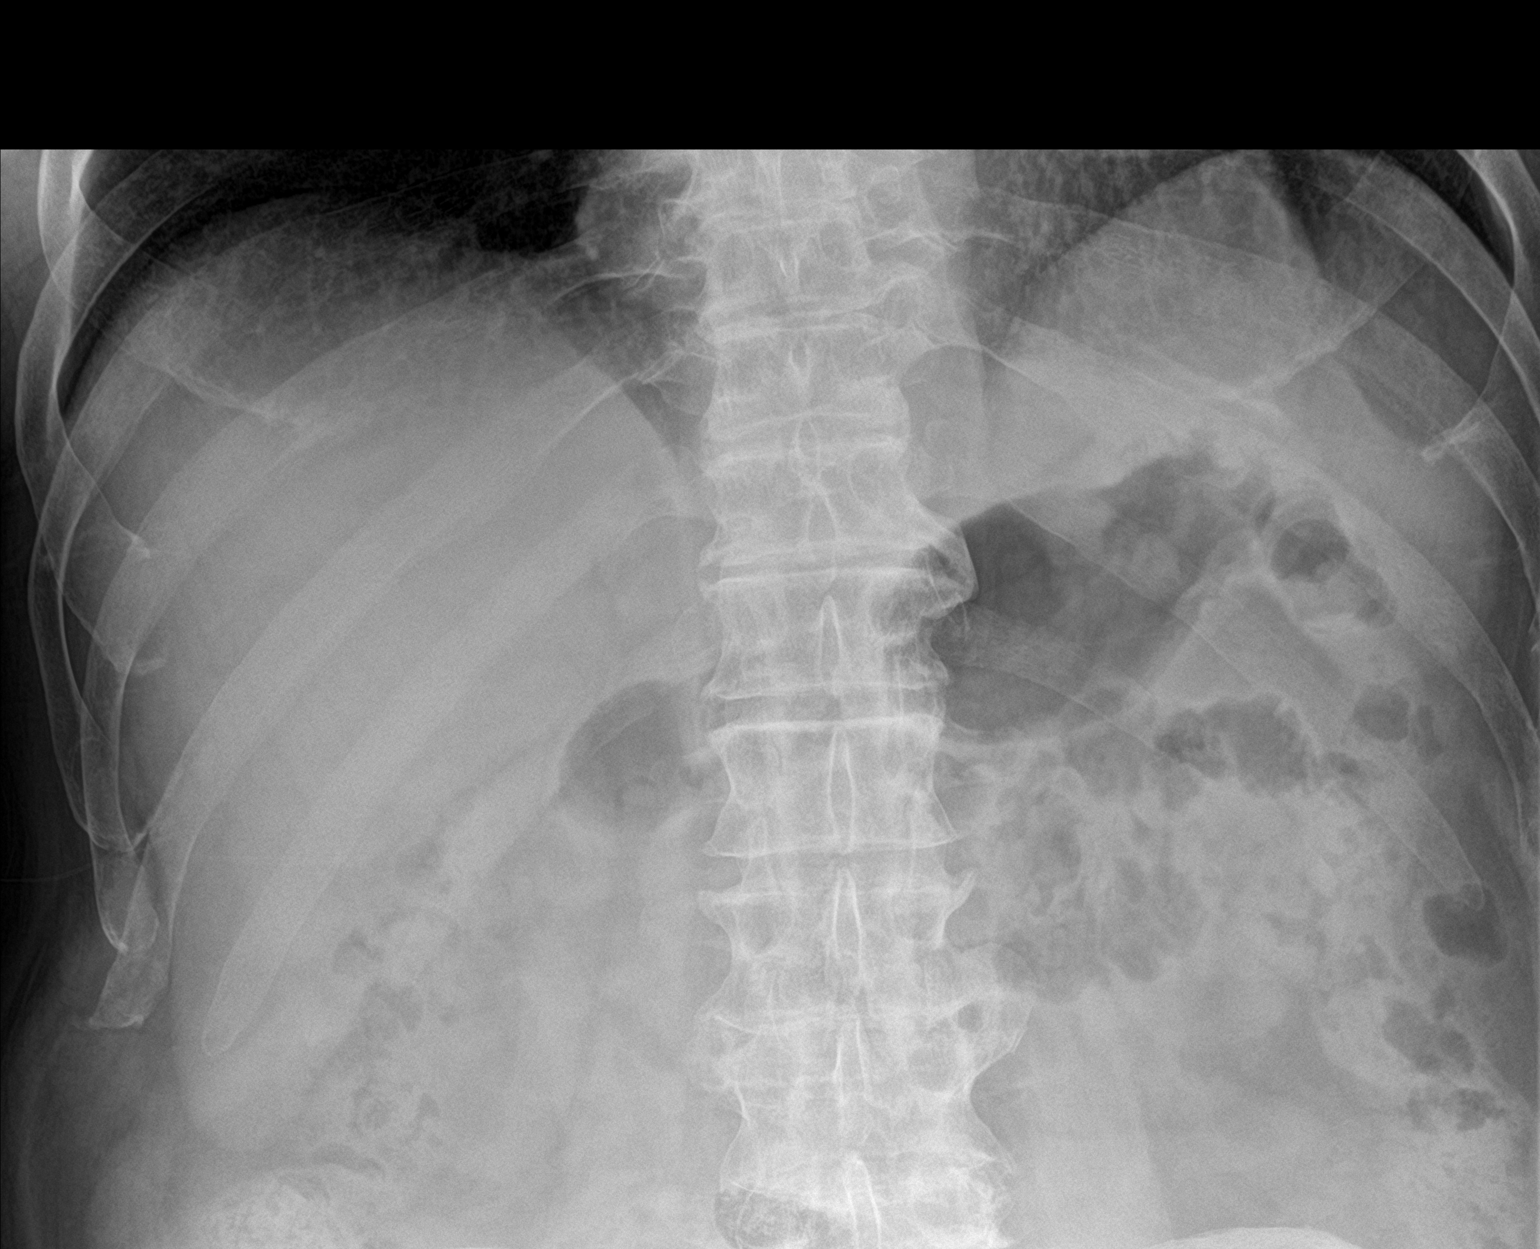
[im 2/2]
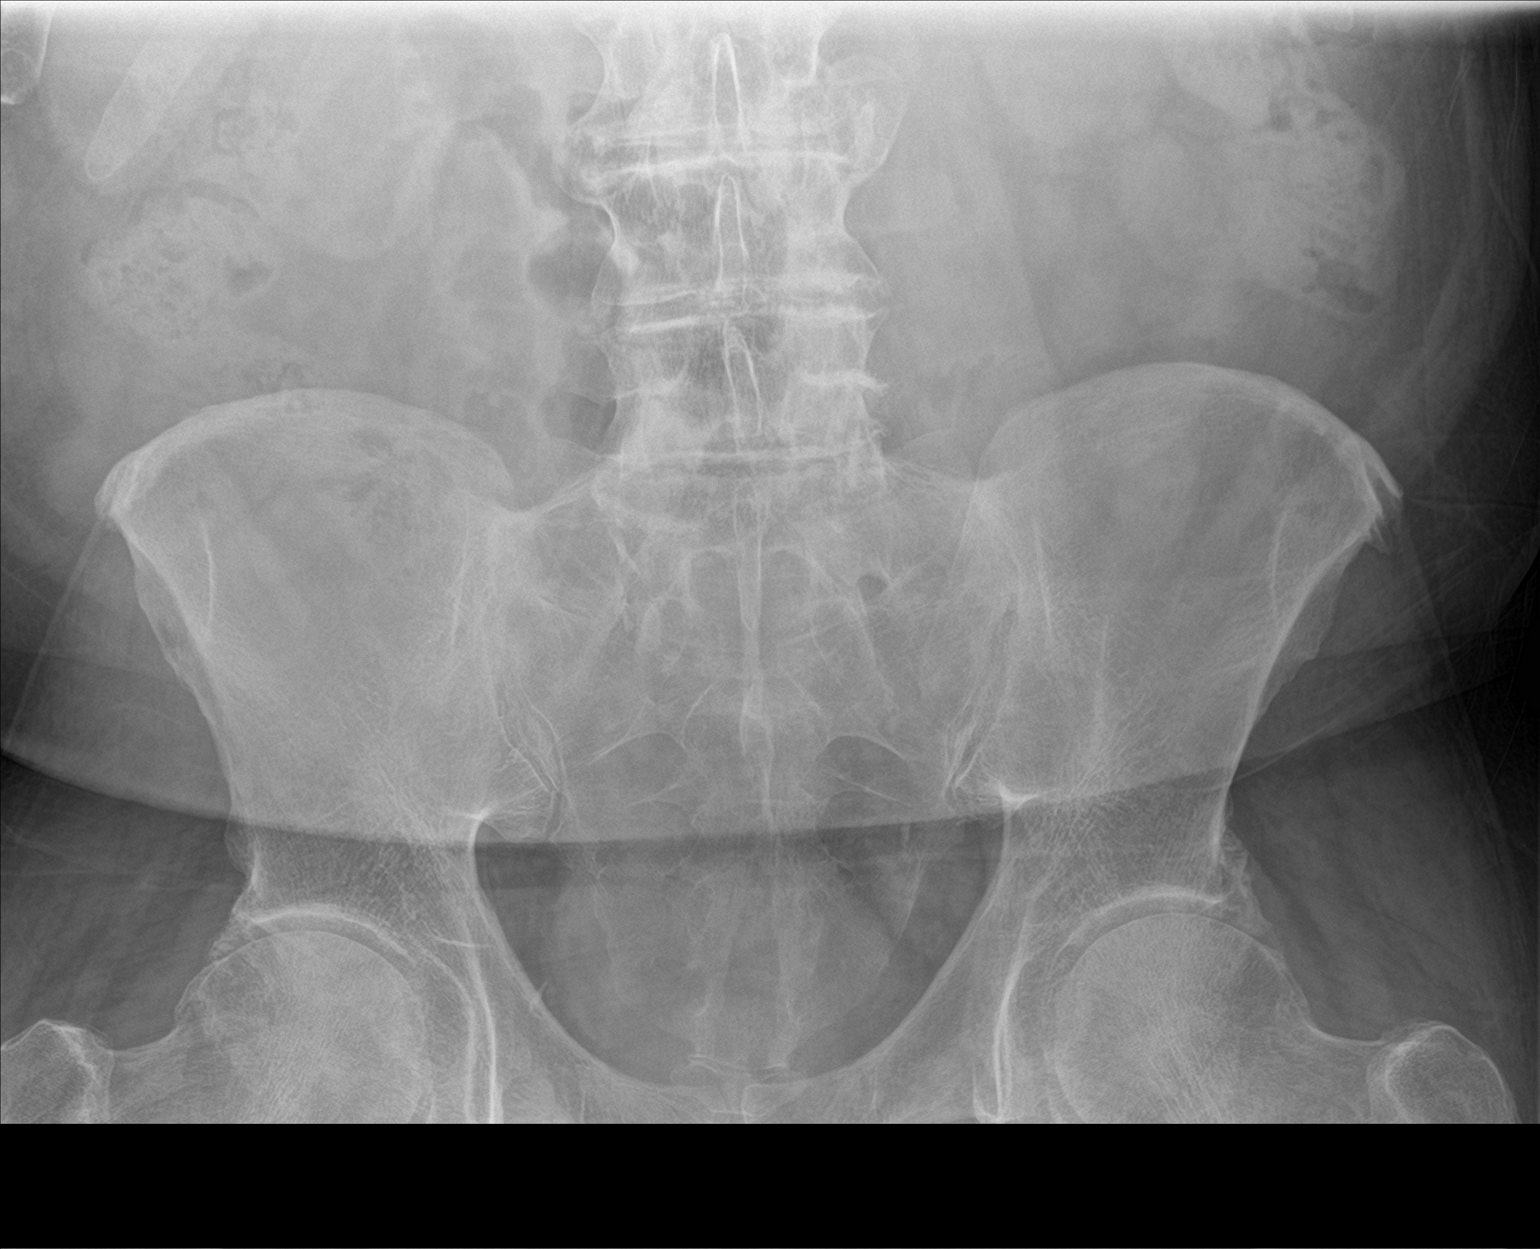

[2 of 2 positions shown; findings below may reference images not displayed]

FINDINGS: Degenerative changes are seen in the hips. No bowel obstruction or
soft tissue abnormality. Degenerative changes seen in the lower
lumbar spine. No other bony abnormalities.
IMPRESSION: No acute abnormalities.

## 2018-04-26 ENCOUNTER — Ambulatory Visit: Payer: Medicare PPO | Admitting: Urology

## 2018-08-19 ENCOUNTER — Other Ambulatory Visit: Payer: Self-pay | Admitting: Urology

## 2018-11-29 ENCOUNTER — Encounter: Payer: Self-pay | Admitting: Urology

## 2018-11-29 ENCOUNTER — Other Ambulatory Visit: Payer: Self-pay

## 2018-11-29 ENCOUNTER — Ambulatory Visit (INDEPENDENT_AMBULATORY_CARE_PROVIDER_SITE_OTHER): Payer: Medicare PPO | Admitting: Urology

## 2018-11-29 VITALS — BP 138/63 | HR 89 | Ht 72.0 in | Wt 237.4 lb

## 2018-11-29 DIAGNOSIS — R351 Nocturia: Secondary | ICD-10-CM

## 2018-11-29 DIAGNOSIS — R972 Elevated prostate specific antigen [PSA]: Secondary | ICD-10-CM

## 2018-11-29 MED ORDER — TAMSULOSIN HCL 0.4 MG PO CAPS: ORAL_CAPSULE | ORAL | 3 refills | Status: DC

## 2018-11-29 NOTE — Progress Notes (Signed)
   11/29/2018 4:35 PM   Andre Black [birth year] [id number]  Reason for visit: Follow up PSA screening, BPH  HPI: I saw Andre Black in urology clinic today in follow-up for PSA screening and BPH.  He is a 73 year old male normally followed by Dr. Lonna Cobb, however he was out of the office today and I was asked to fill in.  He has a history of elevated PSAs, and underwent a biopsy in 2010 for PSA of 8.5 which was negative.  Prostate volume was 50 cc, and follow-up prostate MRI in April 2017 showed no suspicious lesions.  PSA last year was stable at 5.0.  He also has mild urinary symptoms of weak stream and nocturia 3-5 times per night.  He is minimally bothered by the nocturia.  He is already employing behavioral strategies like minimizing fluids in the evening, and voiding prior to bed.  He does snore, and has never been evaluated for sleep apnea.   ROS: Please see flowsheet from today's date for complete review of systems.  Physical Exam: BP 138/63 (BP Location: Left Arm, Patient Position: Sitting, Cuff Size: Normal)   Pulse 89   Ht 6' (1.829 m)   Wt 237 lb 6.4 oz (107.7 kg)   BMI 32.20 kg/m    Constitutional:  Alert and oriented, No acute distress. Respiratory: Normal respiratory effort, no increased work of breathing. GI: Abdomen is soft, nontender, nondistended, no abdominal masses Skin: No rashes, bruises or suspicious lesions. Neurologic: Grossly intact, no focal deficits, moving all 4 extremities. Psychiatric: Normal mood and affect  Laboratory Data: PSA today pending  Assessment & Plan:   In summary, the patient is a 73 year old male with history of negative prostate biopsy in 2010, and negative prostate MRI in 2017, PSA last year was 5 which is within the normal range for his age.  PSA today is pending.  We discussed the AUA guidelines that do not recommend ongoing screening after age 59, and if his PSA is stable today, I would recommend discontinuing screening.  Regarding  his nocturia, we discussed standard behavioral strategies including minimizing fluids in the evening, double voiding prior to bed, and elevating the legs before bedtime.  I also recommended considering being evaluated for sleep apnea as his primary urinary symptoms are nocturia.  Call with PSA results Continue Flomax Follow-up in 1 year with Dr. Lonna Cobb for urinary symptoms   A total of 15 minutes were spent face-to-face with the patient, greater than 50% was spent in patient education, counseling, and coordination of care regarding PSA screening and BPH.  Sondra Come, MD  [facility] [address] [place], [place] [address] [phone]

## 2018-11-30 ENCOUNTER — Telehealth: Payer: Self-pay

## 2018-11-30 LAB — PSA: Prostate Specific Ag, Serum: 4.5 ng/mL — ABNORMAL HIGH (ref 0.0–4.0)

## 2018-11-30 NOTE — Telephone Encounter (Signed)
Called pt informed him of the information below. Pt gave verbal understanding.  

## 2018-11-30 NOTE — Telephone Encounter (Signed)
-----   Message from Sondra Come, MD sent at 11/30/2018  9:51 AM EDT ----- PSA down again to 4.5 from 5, this is normal for his age range. As we discussed in clinic, does not need further PSA screening per AUA prostate cancer screening guidelines  Thanks Legrand Rams, MD 11/30/2018

## 2019-11-27 ENCOUNTER — Encounter: Payer: Self-pay | Admitting: Urology

## 2019-11-27 ENCOUNTER — Ambulatory Visit: Payer: Medicare PPO | Admitting: Urology

## 2019-11-27 ENCOUNTER — Other Ambulatory Visit: Payer: Self-pay

## 2019-11-27 VITALS — BP 136/72 | HR 68 | Ht 71.0 in | Wt 229.0 lb

## 2019-11-27 DIAGNOSIS — N401 Enlarged prostate with lower urinary tract symptoms: Secondary | ICD-10-CM | POA: Diagnosis not present

## 2019-11-27 DIAGNOSIS — R972 Elevated prostate specific antigen [PSA]: Secondary | ICD-10-CM

## 2019-11-27 LAB — BLADDER SCAN AMB NON-IMAGING: Scan Result: 187

## 2019-11-27 NOTE — Progress Notes (Signed)
11/27/2019 3:31 PM   Andre Black 13-Mar-1946 [id number]  Referring provider: Jerl Mina, MD [address] [facility] [place],  [place] [address]  Chief Complaint  Patient presents with  . Elevated PSA    Urologic problem list: -Elevated PSA; prostate biopsy May 2010 for PSA 8.48 with benign pathology, prostate volume 48 cc, prostate MRI 4/17 without suspicious lesions  -BPH with lower urinary tract symptoms; tamsulosin  HPI: 74 y.o. male presents for annual follow-up of BPH and an elevated PSA  -Saw Dr. Richardo Hanks last year -Based on age and PSA stability recommended discontinuing prostate cancer screening -1 month ago noted sensation incomplete emptying and urinary hesitancy -Increase tamsulosin to 0.8 mg with improvement in symptoms -Denies dysuria, gross hematuria -Denies flank, abdominal or pelvic pain   PMH: Past Medical History:  Diagnosis Date  . Broken ankle 04/29/2017  . 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 Elevated PSA 07/31/2013  . Enlarged prostate with lower urinary tract symptoms (LUTS) 07/27/2012  . Hematuria, gross 10/01/2014  . Panic attack 08/16/2014   Overview:  DUE TO A DIVORCE   Overview:  Overview:  DUE TO A DIVORCE   . Reduced libido 07/27/2012    Surgical History: Past Surgical History:  Procedure Laterality Date  . ANKLE FRACTURE SURGERY    . TONSILLECTOMY      Home Medications:  Allergies as of 11/27/2019      Reactions   Other Dermatitis, Hives, Itching, Photosensitivity, Rash, Swelling   Other reaction(s): Other (See Comments) Pt is allergic to all metals and solvents states his skin "falls off" : (chlorocresol, balsam of [place], cobalt chloride, cocamidopropyl betaine, chrome, gold, silver)   Cashew Nut Oil Hives   Iodides    Other reaction(s): Unknown Other reaction(s): UNKNOWN   Balsam Rash   Chlorocresol Rash   Cobalt Rash      Medication List       Accurate as of Nov 27, 2019  3:31 PM. If  you have any questions, ask your nurse or doctor.        amLODipine 5 MG tablet Commonly known as: NORVASC Take 5 mg by mouth daily.   aspirin EC 81 MG tablet Take 81 mg by mouth.   tamsulosin 0.4 MG Caps capsule Commonly known as: FLOMAX TAKE 1 CAPSULE BY MOUTH EVERY DAY       Allergies:  Allergies  Allergen Reactions  . Other Dermatitis, Hives, Itching, Photosensitivity, Rash and Swelling    Other reaction(s): Other (See Comments) Pt is allergic to all metals and solvents states his skin "falls off" : (chlorocresol, balsam of [place], cobalt chloride, cocamidopropyl betaine, chrome, gold, silver)  . Cashew Nut Oil Hives  . Iodides     Other reaction(s): Unknown Other reaction(s): UNKNOWN  . Balsam Rash  . Chlorocresol Rash  . Cobalt Rash    Family History: Family History  Problem Relation Age of Onset  . Bladder Cancer Brother   . Kidney cancer Neg Hx   . Prostate cancer Neg Hx     Social History:  reports that he has quit smoking. He has never used smokeless tobacco. He reports that he does not drink alcohol or use drugs.   Physical Exam: BP 136/72   Pulse 68   Ht 5\' 11"  (1.803 m)   Wt 229 lb (103.9 kg)   BMI 31.94 kg/m   Constitutional:  Alert and oriented, No acute distress. HEENT: [place] AT, moist mucus membranes.  Trachea midline, no  masses. Cardiovascular: No clubbing, cyanosis, or edema. Respiratory: Normal respiratory effort, no increased work of breathing. Skin: No rashes, bruises or suspicious lesions. Neurologic: Grossly intact, no focal deficits, moving all 4 extremities. Psychiatric: Normal mood and affect.   Assessment & Plan:    1.  BPH with LUTS -Voiding symptoms improved on 0.8 mg tamsulosin -Rx sent to pharmacy -PVR by bladder scan 187 mL -Recommend follow-up PVR 6 weeks and to call earlier for worsening voiding symptoms   Abbie Sons, MD  [place] [address] [place], [place]  [address] [phone]

## 2019-11-28 ENCOUNTER — Encounter: Payer: Self-pay | Admitting: Urology

## 2019-11-28 MED ORDER — TAMSULOSIN HCL 0.4 MG PO CAPS: 0.4000 mg | ORAL_CAPSULE | Freq: Two times a day (BID) | ORAL | 3 refills | Status: AC

## 2019-11-29 ENCOUNTER — Ambulatory Visit: Payer: Medicare PPO | Admitting: Urology

## 2019-12-01 ENCOUNTER — Telehealth: Payer: Self-pay | Admitting: Family Medicine

## 2019-12-01 NOTE — Telephone Encounter (Signed)
-----   Message from Riki Altes, MD sent at 11/30/2019  8:22 PM EDT ----- Is this bladder scan correct or a typo?  It was listed in chart message as 187 mL.  If it was [place] he needs to come in earlier for a repeat bladder scan

## 2019-12-14 ENCOUNTER — Other Ambulatory Visit: Payer: Self-pay | Admitting: Urology

## 2020-01-09 ENCOUNTER — Ambulatory Visit: Payer: Self-pay | Admitting: Physician Assistant

## 2020-01-10 ENCOUNTER — Encounter: Payer: Self-pay | Admitting: Physician Assistant

## 2020-06-05 DEATH — deceased

## 2020-11-27 ENCOUNTER — Ambulatory Visit: Payer: Self-pay | Admitting: Urology

## 2020-11-29 ENCOUNTER — Encounter: Payer: Self-pay | Admitting: Urology
